# Patient Record
Sex: Female | Born: 1975 | Race: White | Hispanic: No | Marital: Married | Smoking: Never smoker
Health system: Southern US, Community
[De-identification: ages and names within clinical notes are randomized; demographics above are authoritative.]

## PROBLEM LIST (undated history)

## (undated) DIAGNOSIS — R002 Palpitations: Secondary | ICD-10-CM

## (undated) HISTORY — PX: SKIN BIOPSY: SHX1

---

## 2016-01-16 ENCOUNTER — Emergency Department (HOSPITAL_BASED_OUTPATIENT_CLINIC_OR_DEPARTMENT_OTHER): Payer: 59

## 2016-01-16 ENCOUNTER — Emergency Department (HOSPITAL_BASED_OUTPATIENT_CLINIC_OR_DEPARTMENT_OTHER)
Admission: EM | Admit: 2016-01-16 | Discharge: 2016-01-16 | Disposition: A | Discharge status: Home / Self Care | Payer: 59 | Attending: Emergency Medicine | Admitting: Emergency Medicine

## 2016-01-16 ENCOUNTER — Encounter (HOSPITAL_BASED_OUTPATIENT_CLINIC_OR_DEPARTMENT_OTHER): Payer: Self-pay | Admitting: Emergency Medicine

## 2016-01-16 DIAGNOSIS — R0789 Other chest pain: Secondary | ICD-10-CM | POA: Diagnosis not present

## 2016-01-16 DIAGNOSIS — Z79899 Other long term (current) drug therapy: Secondary | ICD-10-CM | POA: Insufficient documentation

## 2016-01-16 DIAGNOSIS — R079 Chest pain, unspecified: Secondary | ICD-10-CM | POA: Diagnosis present

## 2016-01-16 HISTORY — DX: Palpitations: R00.2

## 2016-01-16 LAB — CBC
HCT: 40.1 % (ref 36.0–46.0)
Hemoglobin: 13.3 g/dL (ref 12.0–15.0)
MCH: 31.1 pg (ref 26.0–34.0)
MCHC: 33.2 g/dL (ref 30.0–36.0)
MCV: 93.7 fL (ref 78.0–100.0)
PLATELETS: 195 10*3/uL (ref 150–400)
RBC: 4.28 MIL/uL (ref 3.87–5.11)
RDW: 12.3 % (ref 11.5–15.5)
WBC: 6 10*3/uL (ref 4.0–10.5)

## 2016-01-16 LAB — BASIC METABOLIC PANEL
ANION GAP: 10 (ref 5–15)
BUN: 12 mg/dL (ref 6–20)
CALCIUM: 9 mg/dL (ref 8.9–10.3)
CO2: 25 mmol/L (ref 22–32)
CREATININE: 0.73 mg/dL (ref 0.44–1.00)
Chloride: 101 mmol/L (ref 101–111)
Glucose, Bld: 112 mg/dL — ABNORMAL HIGH (ref 65–99)
Potassium: 3.6 mmol/L (ref 3.5–5.1)
Sodium: 136 mmol/L (ref 135–145)

## 2016-01-16 LAB — TROPONIN I: Troponin I: <0.03 ng/mL (ref <0.031)

## 2016-01-16 NOTE — ED Provider Notes (Addendum)
CSN: [id number]     Arrival date & time 01/16/16  1150 History   First MD Initiated Contact with Patient 01/16/16 1203     Chief Complaint  Patient presents with  . Chest Pain     (Consider location/radiation/quality/duration/timing/severity/associated sxs/prior Treatment) Patient is a 40 y.o. female presenting with chest pain. The history is provided by the patient.  Chest Pain Pain location:  Substernal area Pain quality: sharp and stabbing   Pain radiates to:  Does not radiate Pain radiates to the back: no   Pain severity:  Moderate Onset quality:  Sudden Duration:  3 minutes Timing:  Constant Progression:  Resolved Chronicity:  Recurrent Context comment:  Sleeping Relieved by:  Nothing Worsened by:  Nothing tried Ineffective treatments:  None tried Associated symptoms: no abdominal pain, no back pain, no cough, no lower extremity edema, no palpitations, no shortness of breath and not vomiting   Risk factors: no coronary artery disease, no diabetes mellitus, no hypertension, no immobilization, not pregnant, no smoking and no surgery   Risk factors comment:  Father and paternal grandfather with MI in their 40's   Past Medical History  Diagnosis Date  . Palpitations    Past Surgical History  Procedure Laterality Date  . Skin biopsy     History reviewed. No pertinent family history. Social History  Substance Use Topics  . Smoking status: Never Smoker   . Smokeless tobacco: None  . Alcohol Use: 0.6 oz/week    1 Glasses of wine per week   OB History    No data available     Review of Systems  Respiratory: Negative for cough and shortness of breath.   Cardiovascular: Positive for chest pain. Negative for palpitations.  Gastrointestinal: Negative for vomiting and abdominal pain.  Musculoskeletal: Negative for back pain.  All other systems reviewed and are negative.     Allergies  Review of patient's allergies indicates no known allergies.  Home Medications    Prior to Admission medications   Medication Sig Start Date End Date Taking? Authorizing Provider  Norethindrone-Ethinyl Estradiol-Fe (LAYOLIS FE) 0.8-25 MG-MCG tablet Chew 1 tablet by mouth daily.   Yes Historical Provider, MD   Ht  (1.702 m)  Wt 120 lb (54.432 kg)  BMI 18.79 kg/m2 Physical Exam  Constitutional: She is oriented to person, place, and time. She appears well-developed and well-nourished. No distress.  HENT:  Head: Normocephalic and atraumatic.  Mouth/Throat: Oropharynx is clear and moist.  Eyes: Conjunctivae and EOM are normal. Pupils are equal, round, and reactive to light.  Neck: Normal range of motion. Neck supple.  Cardiovascular: Normal rate, regular rhythm and intact distal pulses.   No murmur heard. Equal pulses in bilateral upper arms  Pulmonary/Chest: Effort normal and breath sounds normal. No respiratory distress. She has no wheezes. She has no rales.  Abdominal: Soft. She exhibits no distension. There is no tenderness. There is no rebound and no guarding.  Musculoskeletal: Normal range of motion. She exhibits no edema or tenderness.  Neurological: She is alert and oriented to person, place, and time.  Skin: Skin is warm and dry. No rash noted. No erythema.  Psychiatric: She has a normal mood and affect. Her behavior is normal.  Nursing note and vitals reviewed.   ED Course  Procedures (including critical care time) Labs Review Labs Reviewed  BASIC METABOLIC PANEL - Abnormal; Notable for the following:    Glucose, Bld 112 (*)    All other components within normal limits  CBC  TROPONIN I    Imaging Review Dg Chest 2 View  01/16/2016  CLINICAL DATA:  Sharp chest pain while sleeping during the past 2 nights. EXAM: CHEST  2 VIEW COMPARISON:  None. FINDINGS: The heart size and mediastinal contours are within normal limits. Both lungs are clear. The visualized skeletal structures are unremarkable. IMPRESSION: Normal examination. Electronically Signed    By: Beckie Salts M.D.   On: 01/16/2016 13:04   I have personally reviewed and evaluated these images and lab results as part of my medical decision-making.   EKG Interpretation   Date/Time:  Tuesday January 16 2016 11:58:58 EST Ventricular Rate:  97 PR Interval:  142 QRS Duration: 78 QT Interval:  348 QTC Calculation: 441 R Axis:   83 Text Interpretation:  Normal sinus rhythm Possible Left atrial enlargement  Nonspecific T wave abnormality No previous tracing Confirmed by Anitra Lauth   MD, Alphonzo Lemmings ([id number]) on 01/16/2016 12:00:09 PM      MDM   Final diagnoses:  Other chest pain    Pt with atypical story for CP that has been intermittent for some time but woke her up 2 nights ago which is unusual.  Pain only fo 3-9min and sharp.  No associated sx.  Heart score 2 for nonspecific t-wave abnormality and family hx.  Low risk well's and sx not suggestive of dissection.  Pt has a cardiologist due to irregular heart rate but no prior hx of ischemia.  EKG with nonspecific T wave abnormality no prior to compare.   CXR, CBC, BMP, trop pending.  Currently patient is not having pain.  1:09 PM Labs and imaging neg.  Low suspicion for ACS today.  Recommended that pt f/u with cardiology.    Gwyneth Sprout, MD 01/16/16 1309  Gwyneth Sprout, MD 01/16/16 [phone]

## 2016-01-16 NOTE — ED Notes (Signed)
Pt with sharp pains during sleep x 2 different nights, ocasional pain, with shortness of breath

## 2016-01-16 NOTE — ED Notes (Signed)
MD at bedside discussing results and plan with patient and family.

## 2016-10-27 IMAGING — DX DG CHEST 2V
2 series · 2 of 2 positions shown · non-contrast
Comparison: None.

CLINICAL DATA: Sharp chest pain while sleeping during the past 2
nights.

EXAM:
CHEST  2 VIEW

[chest pa]
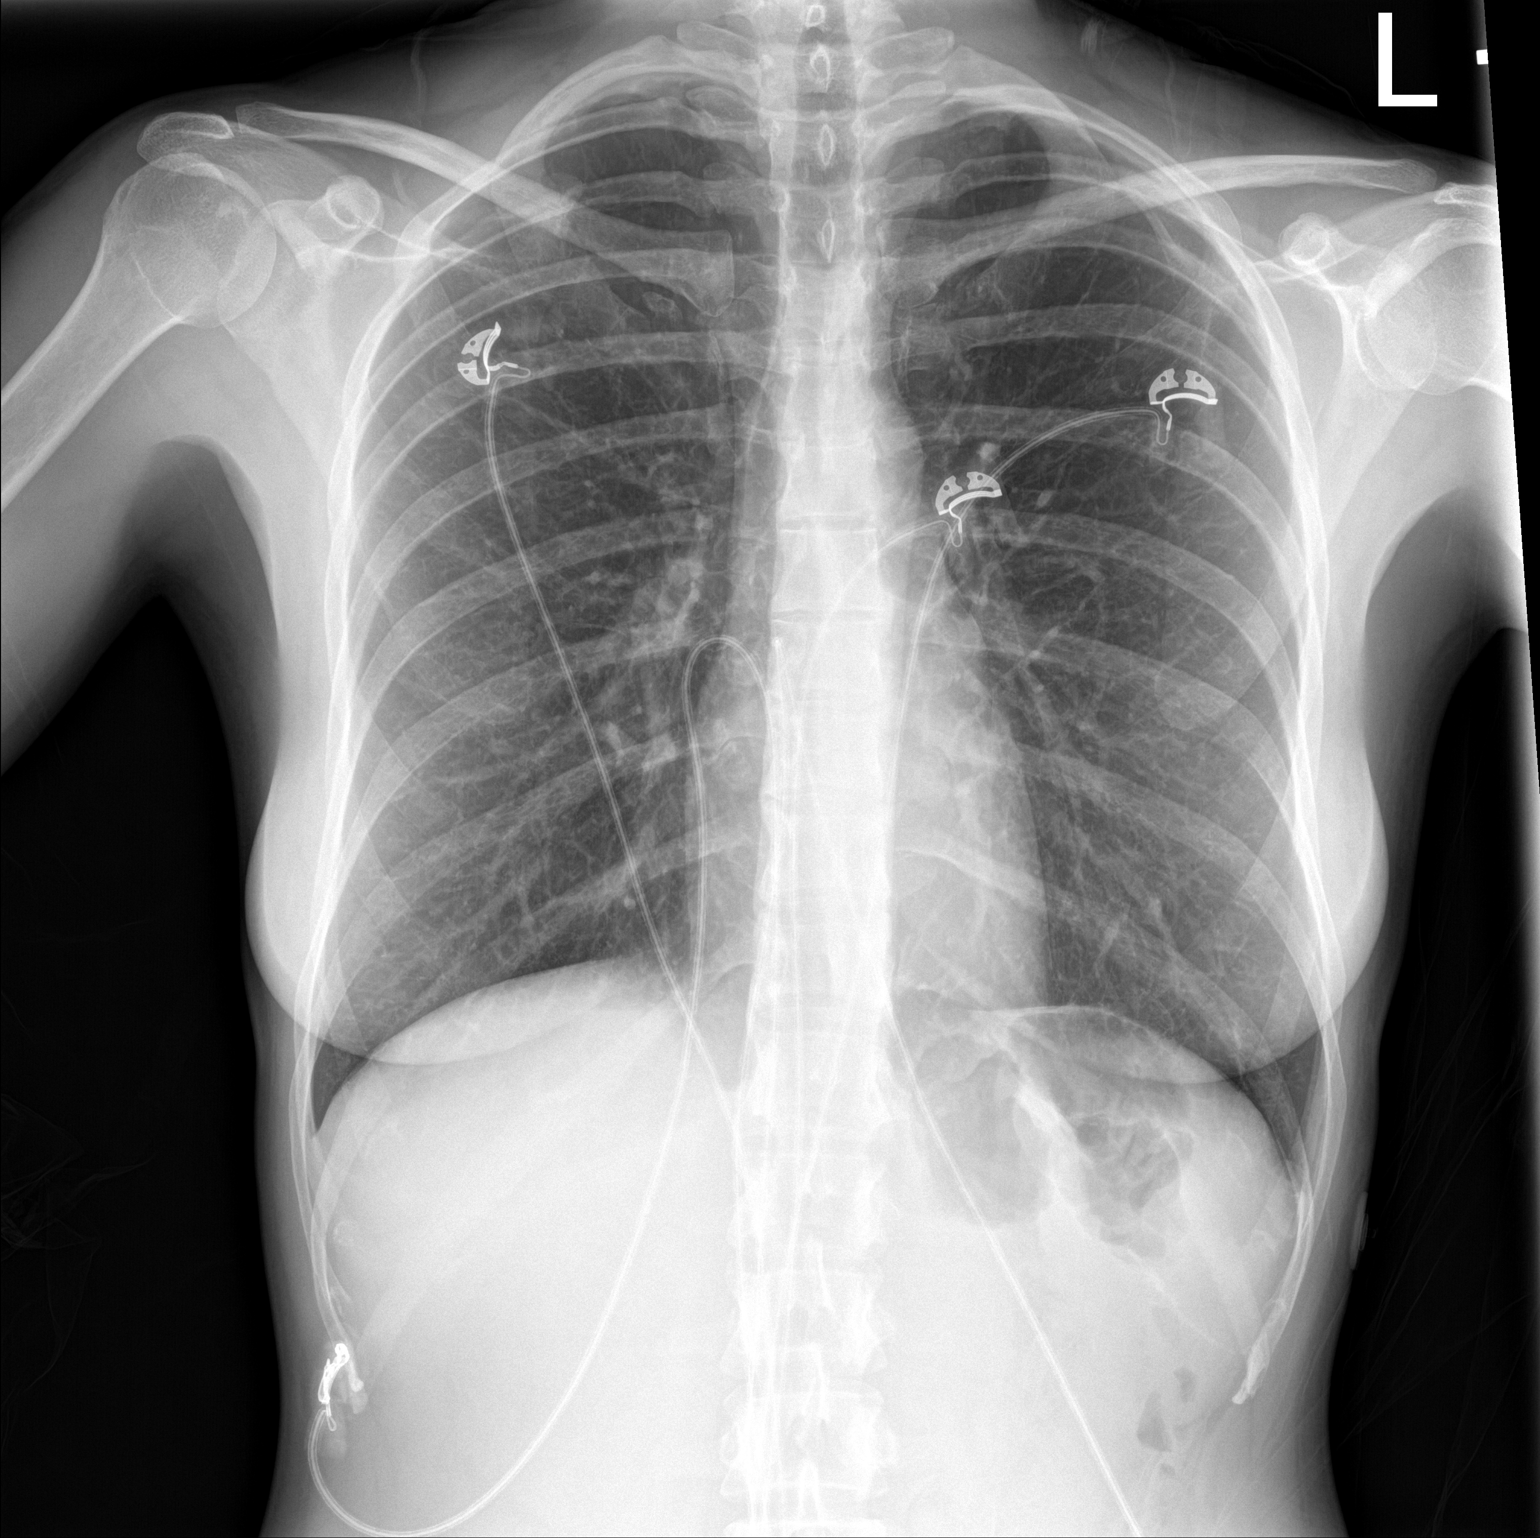

[chest lat]
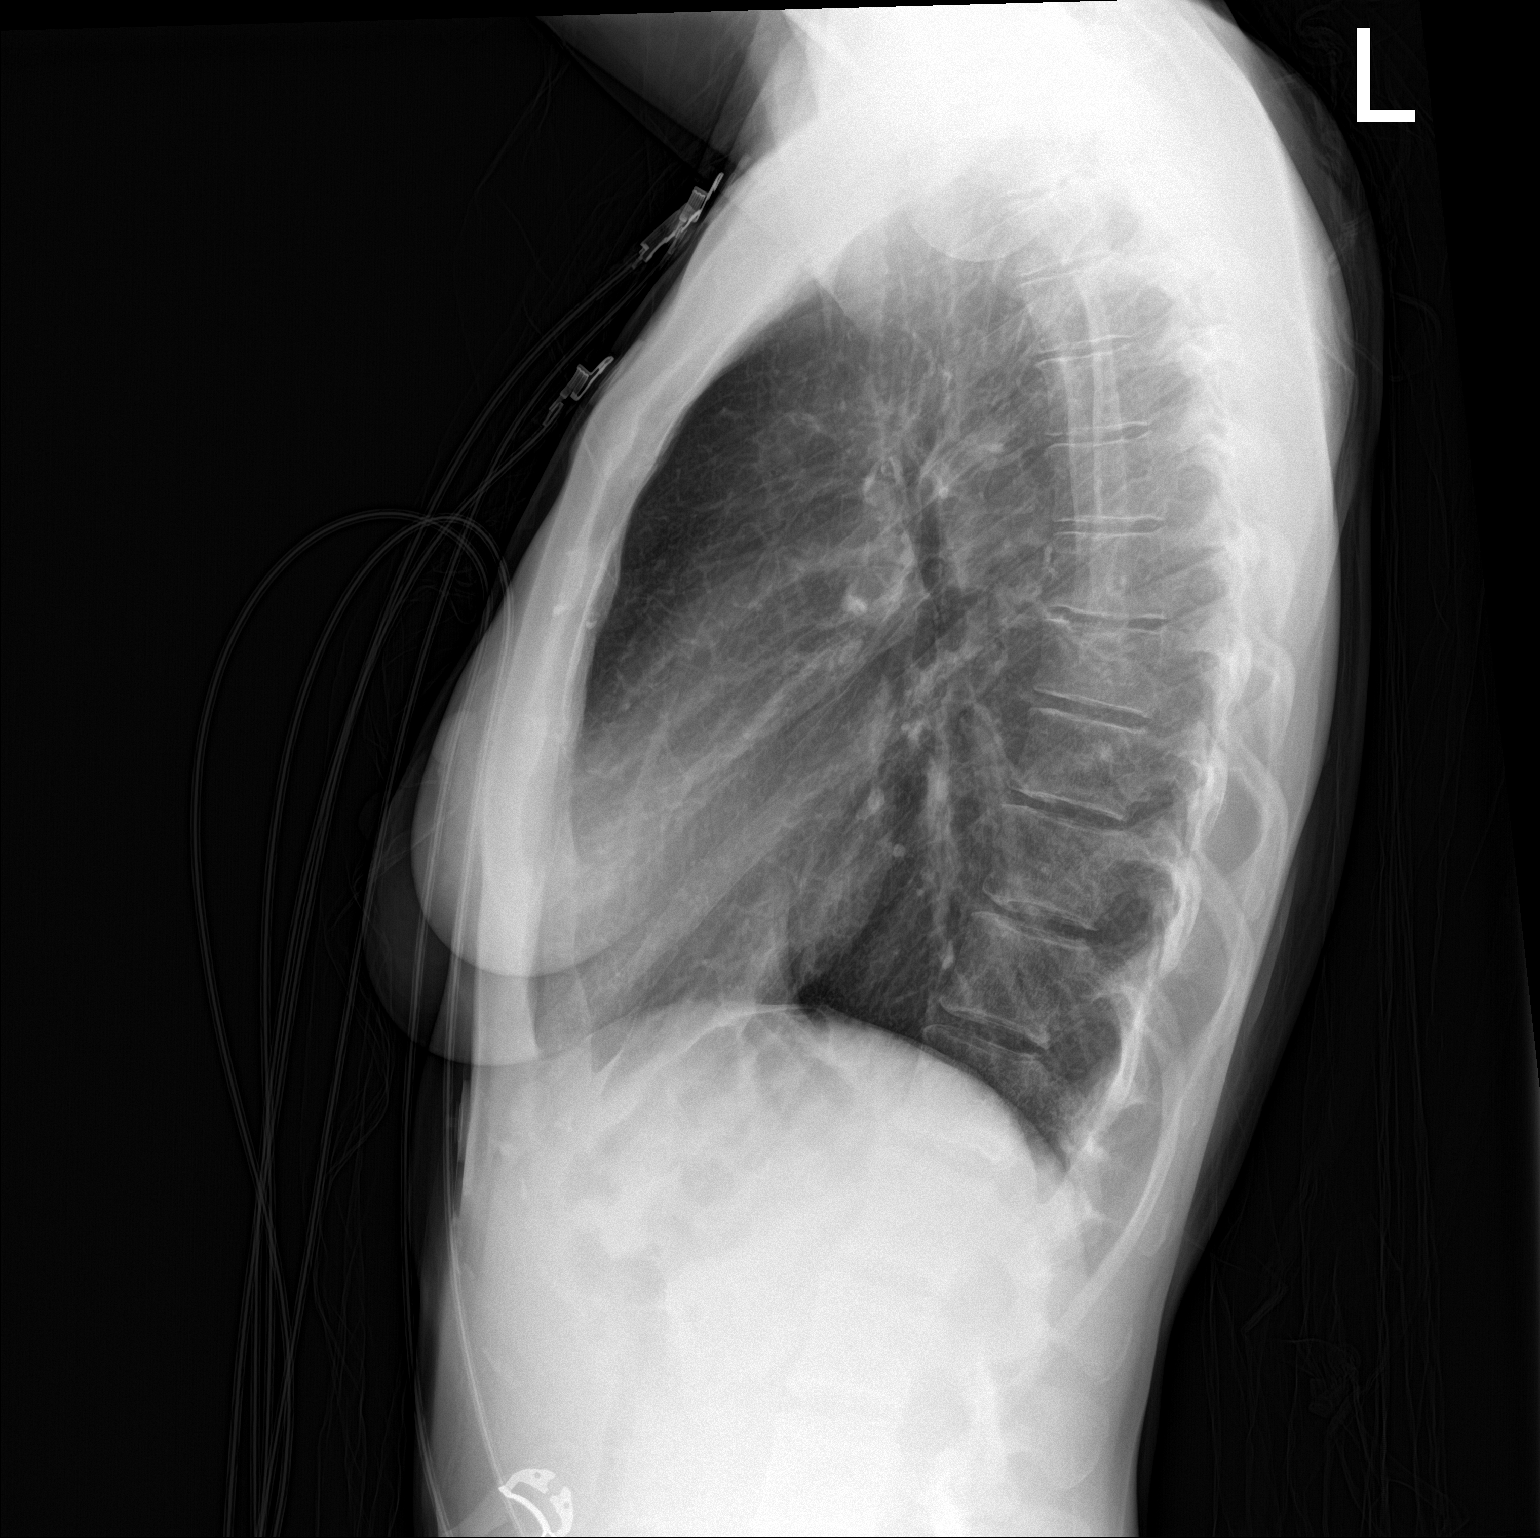

[2 of 2 positions shown; findings below may reference images not displayed]

FINDINGS: The heart size and mediastinal contours are within normal limits.
Both lungs are clear. The visualized skeletal structures are
unremarkable.
IMPRESSION: Normal examination.

## 2024-12-29 ENCOUNTER — Other Ambulatory Visit: Payer: Self-pay | Admitting: Family

## 2024-12-29 DIAGNOSIS — R9389 Abnormal findings on diagnostic imaging of other specified body structures: Secondary | ICD-10-CM

## 2025-01-03 ENCOUNTER — Ambulatory Visit
Admission: RE | Admit: 2025-01-03 | Discharge: 2025-01-03 | Disposition: A | Source: Ambulatory Visit | Attending: Family | Admitting: Family

## 2025-01-03 ENCOUNTER — Inpatient Hospital Stay
Admission: RE | Admit: 2025-01-03 | Discharge: 2025-01-03 | Disposition: A | Source: Ambulatory Visit | Attending: Family | Admitting: Family

## 2025-01-03 DIAGNOSIS — R9389 Abnormal findings on diagnostic imaging of other specified body structures: Secondary | ICD-10-CM

## 2025-01-03 MED ORDER — GADOPICLENOL 0.5 MMOL/ML IV SOLN
5.0000 mL | Freq: Once | INTRAVENOUS | Status: AC | PRN
  Administered 2025-01-03: 5 mL via INTRAVENOUS

## 2025-01-04 LAB — SURGICAL PATHOLOGY
